# Patient Record
Sex: Female | Born: 1975 | Race: White | Hispanic: No | Marital: Married | Smoking: Never smoker
Health system: Southern US, Community
[De-identification: ages and names within clinical notes are randomized; demographics above are authoritative.]

## PROBLEM LIST (undated history)

## (undated) HISTORY — PX: ANAL FISSURECTOMY: SUR608

## (undated) HISTORY — PX: HERNIA REPAIR: SHX51

---

## 2020-05-30 ENCOUNTER — Other Ambulatory Visit: Payer: Self-pay | Admitting: Family Medicine

## 2020-05-30 DIAGNOSIS — R928 Other abnormal and inconclusive findings on diagnostic imaging of breast: Secondary | ICD-10-CM

## 2020-06-07 ENCOUNTER — Other Ambulatory Visit: Payer: Self-pay

## 2020-06-07 ENCOUNTER — Ambulatory Visit
Admission: RE | Admit: 2020-06-07 | Discharge: 2020-06-07 | Disposition: A | Discharge status: Home / Self Care | Payer: Commercial Managed Care - PPO | Source: Ambulatory Visit | Attending: Family Medicine | Admitting: Family Medicine

## 2020-06-07 DIAGNOSIS — R928 Other abnormal and inconclusive findings on diagnostic imaging of breast: Secondary | ICD-10-CM

## 2020-06-20 ENCOUNTER — Encounter: Payer: Self-pay | Admitting: Adult Health

## 2020-06-20 DIAGNOSIS — C50412 Malignant neoplasm of upper-outer quadrant of left female breast: Secondary | ICD-10-CM | POA: Insufficient documentation

## 2020-06-20 DIAGNOSIS — Z170 Estrogen receptor positive status [ER+]: Secondary | ICD-10-CM | POA: Insufficient documentation

## 2020-06-28 ENCOUNTER — Other Ambulatory Visit: Payer: Self-pay | Admitting: General Surgery

## 2020-06-28 ENCOUNTER — Other Ambulatory Visit (HOSPITAL_COMMUNITY): Payer: Self-pay | Admitting: General Surgery

## 2020-06-28 DIAGNOSIS — C50412 Malignant neoplasm of upper-outer quadrant of left female breast: Secondary | ICD-10-CM

## 2020-06-29 ENCOUNTER — Ambulatory Visit (HOSPITAL_COMMUNITY)
Admission: RE | Admit: 2020-06-29 | Discharge: 2020-06-29 | Disposition: A | Discharge status: Home / Self Care | Payer: Commercial Managed Care - PPO | Source: Ambulatory Visit | Attending: General Surgery | Admitting: General Surgery

## 2020-06-29 DIAGNOSIS — Z170 Estrogen receptor positive status [ER+]: Secondary | ICD-10-CM | POA: Insufficient documentation

## 2020-06-29 DIAGNOSIS — C50412 Malignant neoplasm of upper-outer quadrant of left female breast: Secondary | ICD-10-CM | POA: Insufficient documentation

## 2020-06-29 MED ORDER — GADOBUTROL 1 MMOL/ML IV SOLN
7.0000 mL | Freq: Once | INTRAVENOUS | Status: AC | PRN
  Administered 2020-06-29: 7 mL via INTRAVENOUS

## 2020-07-03 ENCOUNTER — Other Ambulatory Visit: Payer: Self-pay | Admitting: General Surgery

## 2020-07-03 DIAGNOSIS — C50412 Malignant neoplasm of upper-outer quadrant of left female breast: Secondary | ICD-10-CM

## 2020-07-06 ENCOUNTER — Other Ambulatory Visit: Payer: Self-pay | Admitting: General Surgery

## 2020-07-06 DIAGNOSIS — C50412 Malignant neoplasm of upper-outer quadrant of left female breast: Secondary | ICD-10-CM

## 2020-07-09 ENCOUNTER — Encounter (HOSPITAL_BASED_OUTPATIENT_CLINIC_OR_DEPARTMENT_OTHER): Payer: Self-pay | Admitting: General Surgery

## 2020-07-09 ENCOUNTER — Other Ambulatory Visit: Payer: Self-pay

## 2020-07-09 ENCOUNTER — Other Ambulatory Visit (HOSPITAL_COMMUNITY)
Admission: RE | Admit: 2020-07-09 | Discharge: 2020-07-09 | Disposition: A | Discharge status: Home / Self Care | Payer: Commercial Managed Care - PPO | Source: Ambulatory Visit | Attending: General Surgery | Admitting: General Surgery

## 2020-07-09 DIAGNOSIS — Z20822 Contact with and (suspected) exposure to covid-19: Secondary | ICD-10-CM | POA: Insufficient documentation

## 2020-07-09 DIAGNOSIS — Z01812 Encounter for preprocedural laboratory examination: Secondary | ICD-10-CM | POA: Diagnosis present

## 2020-07-09 LAB — SARS CORONAVIRUS 2 (TAT 6-24 HRS): SARS Coronavirus 2: NEGATIVE

## 2020-07-10 ENCOUNTER — Ambulatory Visit
Admission: RE | Admit: 2020-07-10 | Discharge: 2020-07-10 | Disposition: A | Discharge status: Home / Self Care | Payer: Commercial Managed Care - PPO | Source: Ambulatory Visit | Attending: General Surgery | Admitting: General Surgery

## 2020-07-10 DIAGNOSIS — C50412 Malignant neoplasm of upper-outer quadrant of left female breast: Secondary | ICD-10-CM

## 2020-07-10 NOTE — Progress Notes (Signed)

## 2020-07-10 NOTE — H&P (Signed)
Theresa Tyler Appointment: 07/03/2020 2:30 PM Location: [facility] Patient #: [id number] DOB: Oct 29, 1975 Married / Language: Theresa Tyler / Race: White Female   History of Present Illness Stark Klein MD; 07/03/2020 3:12 PM) The patient is a 45 year old female who presents for a follow-up for Breast cancer. Pt is a lovely 45 yo F referred by Dr. Melanee Spry for consultation regarding a new left breast cancer 06/2020. She was found to have an area of distortion in the upper outer quadrant on screening. This was confirmed on diagnostic imaging and there was no ultrasound correlate and nodes were negative by ultrasound. 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 This was +/+/- with Ki 67 20%. 5 mm of cancer were seen on the biopsy. the clip migrated 1 cm from the lesion. 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 She has family history of pancreatic cancer in her paternal grandfather, colon polyps in her father, and a maternal aunt had breast cancer. She is a G2P2 who had menarche at age 34. First child was in the late 20s.   She is here to follow up. She had MRI showing only a 3 mm mass on the left. 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 (multicancer panel - 84 gene test). She is here to discuss.   MRI 06/29/20 FINDINGS: Breast composition: c. Heterogeneous fibroglandular tissue.  Background parenchymal enhancement: Moderate  Right breast: No mass or abnormal enhancement.  Left breast: There is a 3 x 3 mm focal enhancement with associated post biopsy artifact in the posterior upper-outer quadrant left breast consistent with recently diagnosed left breast cancer.  Lymph nodes: No abnormal appearing lymph nodes.  Ancillary findings: None.  IMPRESSION: Known recently biopsied left breast cancer in the posterior upper-outer quadrant left  breast.  RECOMMENDATION: Treatment plan  BI-RADS CATEGORY 6: Known biopsy-proven malignancy. dx mammo/us on left 05/30/20 COMPARISON: Previous exam(s). ACR Breast Density Category c: The breast tissue is heterogeneously dense, which may obscure small masses. FINDINGS: Spot compression tomosynthesis images in the CC and MLO projection as well as full paddle rolled medial and lateral CC images and a full paddle true lateral image were performed. These images demonstrate a possible persistent subtle area of distortion in the superior to upper-outer quadrant of the left breast. No suspicious palpable masses are identified in the superior to upper-outer quadrant of the left breast. Ultrasound of the upper-outer quadrant of the left breast demonstrates normal fibroglandular tissue. No suspicious masses or areas of shadowing are identified. Ultrasound of the left axilla demonstrates normal-appearing lymph nodes.   pathology 06/07/20 Breast, left, needle core biopsy, left breast upper outer - INVASIVE DUCTAL CARCINOMA, SEE COMMENT. - DUCTAL CARCINOMA IN SITU WITH CALCIFICATIONS AND NECROSIS The carcinoma appears grade 2 and measures 5 mm in greatest linear extent. E-cadherin is positive. The tumor cells are NEGATIVE for Her2 (1+). Estrogen Receptor: 100%, POSITIVE, STRONG STAINING INTENSITY Progesterone Receptor: 90%, POSITIVE, STRONG STAINING INTENSITY Proliferation Marker Ki67: 20%   Allergies [name], CNA; 07/03/2020 2:41 PM) Macrobid *ANTI-INFECTIVE AGENTS - MISC.*  Ceftin *CEPHALOSPORINS*  Allergies Reconciled   Medication History [name], CNA; 07/03/2020 2:41 PM) No Current Medications Medications Reconciled    Review of Systems Stark Klein MD; 07/03/2020 3:12 PM) All other systems negative   Physical Exam Stark Klein MD; 07/03/2020 3:13 PM) General Mental Status-Alert. General  Appearance-Consistent with stated age. Hydration-Well  hydrated. Voice-Normal.  Head and Neck Head-normocephalic, atraumatic with no lesions or palpable masses.  Breast Note: no bruising UOQ left breast.   Neurologic Neurologic evaluation reveals -alert and oriented x 3 with no impairment of recent or remote memory. Mental Status-Normal.  Musculoskeletal Global Assessment -Note: no gross deformities.  Normal Exam - Left-Upper Extremity Strength Normal and Lower Extremity Strength Normal. Normal Exam - Right-Upper Extremity Strength Normal and Lower Extremity Strength Normal.    Assessment & Plan Stark Klein MD; 07/03/2020 3:14 PM) MALIGNANT NEOPLASM OF UPPER-OUTER QUADRANT OF LEFT BREAST IN FEMALE, ESTROGEN RECEPTOR POSITIVE (C50.412) Impression: MR negative for any large mass or other concerning lesions.  Genetics negative. Will plan left seed localized lumpectomy and SLN bx.  The surgical procedure was described to the patient. I discussed the incision type and location and that we would need radiology involved on with a wire or seed marker and/or sentinel node.  The risks and benefits of the procedure were described to the patient and she wishes to proceed.  We discussed the risks bleeding, infection, damage to other structures, need for further procedures/surgeries. We discussed the risk of seroma. The patient was advised if the area in the breast in cancer, we may need to go back to surgery for additional tissue to obtain negative margins or for a lymph node biopsy. The patient was advised that these are the most common complications, but that others can occur as well. They were advised against taking aspirin or other anti-inflammatory agents/blood thinners the week before surgery. Current Plans You are being scheduled for surgery- Our schedulers will call you.  You should hear from our office's scheduling department within 5 working days about the location, date, and time of surgery. We try to make  accommodations for patient's preferences in scheduling surgery, but sometimes the OR schedule or the surgeon's schedule prevents [place] from making those accommodations.  If you have not heard from our office [phone]) in 5 working days, call the office and ask for your surgeon's nurse.  If you have other questions about your diagnosis, plan, or surgery, call the office and ask for your surgeon's nurse.  Pt Education - flb breast cancer surgery: discussed with patient and provided information.   Signed electronically by Stark Klein, MD (07/03/2020 3:14 PM)

## 2020-07-12 ENCOUNTER — Ambulatory Visit (HOSPITAL_BASED_OUTPATIENT_CLINIC_OR_DEPARTMENT_OTHER)
Admission: RE | Admit: 2020-07-12 | Discharge: 2020-07-12 | Disposition: A | Discharge status: Home / Self Care | Payer: Commercial Managed Care - PPO | Attending: General Surgery | Admitting: General Surgery

## 2020-07-12 ENCOUNTER — Ambulatory Visit (HOSPITAL_BASED_OUTPATIENT_CLINIC_OR_DEPARTMENT_OTHER): Payer: Commercial Managed Care - PPO | Admitting: Anesthesiology

## 2020-07-12 ENCOUNTER — Ambulatory Visit (HOSPITAL_COMMUNITY)
Admission: RE | Admit: 2020-07-12 | Discharge: 2020-07-12 | Disposition: A | Discharge status: Home / Self Care | Payer: Commercial Managed Care - PPO | Source: Ambulatory Visit | Attending: General Surgery | Admitting: General Surgery

## 2020-07-12 ENCOUNTER — Ambulatory Visit
Admission: RE | Admit: 2020-07-12 | Discharge: 2020-07-12 | Disposition: A | Discharge status: Home / Self Care | Payer: Commercial Managed Care - PPO | Source: Ambulatory Visit | Attending: General Surgery | Admitting: General Surgery

## 2020-07-12 ENCOUNTER — Other Ambulatory Visit: Payer: Self-pay

## 2020-07-12 ENCOUNTER — Encounter (HOSPITAL_BASED_OUTPATIENT_CLINIC_OR_DEPARTMENT_OTHER): Payer: Self-pay | Admitting: General Surgery

## 2020-07-12 ENCOUNTER — Encounter (HOSPITAL_BASED_OUTPATIENT_CLINIC_OR_DEPARTMENT_OTHER)
Admission: RE | Disposition: A | Discharge status: Home / Self Care | Payer: Self-pay | Source: Home / Self Care | Attending: General Surgery

## 2020-07-12 DIAGNOSIS — Z803 Family history of malignant neoplasm of breast: Secondary | ICD-10-CM | POA: Insufficient documentation

## 2020-07-12 DIAGNOSIS — Z170 Estrogen receptor positive status [ER+]: Secondary | ICD-10-CM

## 2020-07-12 DIAGNOSIS — C50412 Malignant neoplasm of upper-outer quadrant of left female breast: Secondary | ICD-10-CM | POA: Insufficient documentation

## 2020-07-12 HISTORY — PX: BREAST LUMPECTOMY WITH RADIOACTIVE SEED LOCALIZATION: SHX6424

## 2020-07-12 HISTORY — PX: BREAST LUMPECTOMY WITH RADIOACTIVE SEED AND SENTINEL LYMPH NODE BIOPSY: SHX6550

## 2020-07-12 LAB — POCT PREGNANCY, URINE: Preg Test, Ur: NEGATIVE

## 2020-07-12 SURGERY — BREAST LUMPECTOMY WITH RADIOACTIVE SEED AND SENTINEL LYMPH NODE BIOPSY
Anesthesia: General | Site: Breast

## 2020-07-12 MED ORDER — PROMETHAZINE HCL 25 MG/ML IJ SOLN: 6.2500 mg | INTRAMUSCULAR | Status: DC | PRN

## 2020-07-12 MED ORDER — SODIUM CHLORIDE (PF) 0.9 % IJ SOLN
INTRAMUSCULAR | Status: AC
  Filled 2020-07-12: qty 20

## 2020-07-12 MED ORDER — FENTANYL CITRATE (PF) 100 MCG/2ML IJ SOLN
100.0000 ug | Freq: Once | INTRAMUSCULAR | Status: AC
  Administered 2020-07-12: 100 ug via INTRAVENOUS

## 2020-07-12 MED ORDER — MIDAZOLAM HCL 2 MG/2ML IJ SOLN
2.0000 mg | Freq: Once | INTRAMUSCULAR | Status: AC
  Administered 2020-07-12: 2 mg via INTRAVENOUS

## 2020-07-12 MED ORDER — PROPOFOL 10 MG/ML IV BOLUS
INTRAVENOUS | Status: DC | PRN
  Administered 2020-07-12: 200 mg via INTRAVENOUS

## 2020-07-12 MED ORDER — CHLORHEXIDINE GLUCONATE CLOTH 2 % EX PADS: 6.0000 | MEDICATED_PAD | Freq: Once | CUTANEOUS | Status: DC

## 2020-07-12 MED ORDER — CEFAZOLIN SODIUM-DEXTROSE 2-4 GM/100ML-% IV SOLN
2.0000 g | INTRAVENOUS | Status: AC
  Administered 2020-07-12: 2 g via INTRAVENOUS

## 2020-07-12 MED ORDER — METHYLENE BLUE 0.5 % INJ SOLN
INTRAVENOUS | Status: AC
  Filled 2020-07-12: qty 20

## 2020-07-12 MED ORDER — HYDROMORPHONE HCL 1 MG/ML IJ SOLN: 0.2500 mg | INTRAMUSCULAR | Status: DC | PRN

## 2020-07-12 MED ORDER — OXYCODONE HCL 5 MG/5ML PO SOLN: 5.0000 mg | Freq: Once | ORAL | Status: AC | PRN

## 2020-07-12 MED ORDER — FENTANYL CITRATE (PF) 100 MCG/2ML IJ SOLN
INTRAMUSCULAR | Status: AC
  Filled 2020-07-12: qty 2

## 2020-07-12 MED ORDER — AMISULPRIDE (ANTIEMETIC) 5 MG/2ML IV SOLN: 10.0000 mg | Freq: Once | INTRAVENOUS | Status: DC | PRN

## 2020-07-12 MED ORDER — ROPIVACAINE HCL 5 MG/ML IJ SOLN
INTRAMUSCULAR | Status: DC | PRN
  Administered 2020-07-12: 30 mL via PERINEURAL

## 2020-07-12 MED ORDER — MIDAZOLAM HCL 2 MG/2ML IJ SOLN
INTRAMUSCULAR | Status: AC
  Filled 2020-07-12: qty 2

## 2020-07-12 MED ORDER — LIDOCAINE-EPINEPHRINE (PF) 1 %-1:200000 IJ SOLN
INTRAMUSCULAR | Status: DC | PRN
  Administered 2020-07-12: 13 mL via INTRAMUSCULAR

## 2020-07-12 MED ORDER — PROPOFOL 10 MG/ML IV BOLUS
INTRAVENOUS | Status: AC
  Filled 2020-07-12: qty 20

## 2020-07-12 MED ORDER — FENTANYL CITRATE (PF) 100 MCG/2ML IJ SOLN
INTRAMUSCULAR | Status: DC | PRN
  Administered 2020-07-12: 25 ug via INTRAVENOUS
  Administered 2020-07-12: 50 ug via INTRAVENOUS

## 2020-07-12 MED ORDER — DEXAMETHASONE SODIUM PHOSPHATE 4 MG/ML IJ SOLN
INTRAMUSCULAR | Status: DC | PRN
  Administered 2020-07-12: 10 mg via INTRAVENOUS

## 2020-07-12 MED ORDER — OXYCODONE HCL 5 MG PO TABS
5.0000 mg | ORAL_TABLET | Freq: Once | ORAL | Status: AC | PRN
  Administered 2020-07-12: 5 mg via ORAL

## 2020-07-12 MED ORDER — LIDOCAINE HCL (CARDIAC) PF 100 MG/5ML IV SOSY
PREFILLED_SYRINGE | INTRAVENOUS | Status: DC | PRN
  Administered 2020-07-12: 60 mg via INTRAVENOUS

## 2020-07-12 MED ORDER — LACTATED RINGERS IV SOLN: INTRAVENOUS | Status: DC

## 2020-07-12 MED ORDER — OXYCODONE HCL 5 MG PO TABS
ORAL_TABLET | ORAL | Status: AC
  Filled 2020-07-12: qty 1

## 2020-07-12 MED ORDER — TECHNETIUM TC 99M TILMANOCEPT KIT
1.0000 | PACK | Freq: Once | INTRAVENOUS | Status: AC | PRN
  Administered 2020-07-12: 1 via INTRADERMAL

## 2020-07-12 MED ORDER — MEPERIDINE HCL 25 MG/ML IJ SOLN: 6.2500 mg | INTRAMUSCULAR | Status: DC | PRN

## 2020-07-12 MED ORDER — ONDANSETRON HCL 4 MG/2ML IJ SOLN
INTRAMUSCULAR | Status: DC | PRN
  Administered 2020-07-12: 4 mg via INTRAVENOUS

## 2020-07-12 MED ORDER — ACETAMINOPHEN 500 MG PO TABS
1000.0000 mg | ORAL_TABLET | ORAL | Status: AC
  Administered 2020-07-12: 1000 mg via ORAL

## 2020-07-12 MED ORDER — DROPERIDOL 2.5 MG/ML IJ SOLN
INTRAMUSCULAR | Status: DC | PRN
  Administered 2020-07-12: .625 mg via INTRAVENOUS

## 2020-07-12 MED ORDER — ACETAMINOPHEN 500 MG PO TABS
ORAL_TABLET | ORAL | Status: AC
  Filled 2020-07-12: qty 2

## 2020-07-12 MED ORDER — OXYCODONE HCL 5 MG PO TABS: 5.0000 mg | ORAL_TABLET | Freq: Four times a day (QID) | ORAL | 0 refills | Status: AC | PRN

## 2020-07-12 MED ORDER — MIDAZOLAM HCL 5 MG/5ML IJ SOLN
INTRAMUSCULAR | Status: DC | PRN
  Administered 2020-07-12: 1 mg via INTRAVENOUS

## 2020-07-12 MED ORDER — CEFAZOLIN SODIUM-DEXTROSE 2-4 GM/100ML-% IV SOLN
INTRAVENOUS | Status: AC
  Filled 2020-07-12: qty 100

## 2020-07-12 SURGICAL SUPPLY — 52 items
ADH SKN CLS APL DERMABOND .7 (GAUZE/BANDAGES/DRESSINGS) ×2
APL PRP STRL LF DISP 70% ISPRP (MISCELLANEOUS) ×2
BINDER BREAST LRG (GAUZE/BANDAGES/DRESSINGS) IMPLANT
BINDER BREAST MEDIUM (GAUZE/BANDAGES/DRESSINGS) IMPLANT
BINDER BREAST XLRG (GAUZE/BANDAGES/DRESSINGS) IMPLANT
BINDER BREAST XXLRG (GAUZE/BANDAGES/DRESSINGS) IMPLANT
BLADE SURG 10 STRL SS (BLADE) ×3 IMPLANT
BLADE SURG 15 STRL LF DISP TIS (BLADE) ×2 IMPLANT
BLADE SURG 15 STRL SS (BLADE) ×3
BNDG COHESIVE 4X5 TAN STRL (GAUZE/BANDAGES/DRESSINGS) ×3 IMPLANT
CANISTER SUC SOCK COL 7IN (MISCELLANEOUS) IMPLANT
CANISTER SUCT 1200ML W/VALVE (MISCELLANEOUS) IMPLANT
CHLORAPREP W/TINT 26 (MISCELLANEOUS) ×3 IMPLANT
CLIP VESOCCLUDE LG 6/CT (CLIP) ×3 IMPLANT
CLIP VESOCCLUDE MED 6/CT (CLIP) ×12 IMPLANT
COVER MAYO STAND STRL (DRAPES) ×3 IMPLANT
COVER PROBE W GEL 5X96 (DRAPES) ×3 IMPLANT
DECANTER SPIKE VIAL GLASS SM (MISCELLANEOUS) IMPLANT
DERMABOND ADVANCED (GAUZE/BANDAGES/DRESSINGS) ×1
DERMABOND ADVANCED .7 DNX12 (GAUZE/BANDAGES/DRESSINGS) ×2 IMPLANT
DRAPE UTILITY XL STRL (DRAPES) ×3 IMPLANT
ELECT COATED BLADE 2.86 ST (ELECTRODE) ×3 IMPLANT
ELECT REM PT RETURN 9FT ADLT (ELECTROSURGICAL) ×3
ELECTRODE REM PT RTRN 9FT ADLT (ELECTROSURGICAL) ×2 IMPLANT
GAUZE SPONGE 4X4 12PLY STRL LF (GAUZE/BANDAGES/DRESSINGS) ×3 IMPLANT
GLOVE SURG ENC MOIS LTX SZ6 (GLOVE) ×3 IMPLANT
GLOVE SURG UNDER POLY LF SZ6.5 (GLOVE) ×3 IMPLANT
GOWN STRL REUS W/ TWL LRG LVL3 (GOWN DISPOSABLE) ×2 IMPLANT
GOWN STRL REUS W/TWL 2XL LVL3 (GOWN DISPOSABLE) ×3 IMPLANT
GOWN STRL REUS W/TWL LRG LVL3 (GOWN DISPOSABLE) ×3
KIT MARKER MARGIN INK (KITS) ×3 IMPLANT
LIGHT WAVEGUIDE WIDE FLAT (MISCELLANEOUS) ×3 IMPLANT
NEEDLE HYPO 25X1 1.5 SAFETY (NEEDLE) ×3 IMPLANT
NS IRRIG 1000ML POUR BTL (IV SOLUTION) ×3 IMPLANT
PACK BASIN DAY SURGERY FS (CUSTOM PROCEDURE TRAY) ×3 IMPLANT
PACK UNIVERSAL I (CUSTOM PROCEDURE TRAY) ×3 IMPLANT
PENCIL SMOKE EVACUATOR (MISCELLANEOUS) ×3 IMPLANT
SLEEVE SCD COMPRESS KNEE MED (STOCKING) ×3 IMPLANT
SPONGE LAP 18X18 RF (DISPOSABLE) ×3 IMPLANT
STOCKINETTE IMPERVIOUS LG (DRAPES) ×3 IMPLANT
STRIP CLOSURE SKIN 1/2X4 (GAUZE/BANDAGES/DRESSINGS) ×3 IMPLANT
SUT MNCRL AB 4-0 PS2 18 (SUTURE) ×3 IMPLANT
SUT SILK 2 0 SH (SUTURE) IMPLANT
SUT VIC AB 2-0 SH 27 (SUTURE) ×3
SUT VIC AB 2-0 SH 27XBRD (SUTURE) ×2 IMPLANT
SUT VIC AB 3-0 SH 27 (SUTURE) ×3
SUT VIC AB 3-0 SH 27X BRD (SUTURE) ×2 IMPLANT
SYR CONTROL 10ML LL (SYRINGE) ×3 IMPLANT
TOWEL GREEN STERILE FF (TOWEL DISPOSABLE) ×3 IMPLANT
TRAY FAXITRON CT DISP (TRAY / TRAY PROCEDURE) ×3 IMPLANT
TUBE CONNECTING 20X1/4 (TUBING) IMPLANT
YANKAUER SUCT BULB TIP NO VENT (SUCTIONS) ×3 IMPLANT

## 2020-07-12 NOTE — Progress Notes (Signed)
Nuclear medicine at bedside. RN remained with patient and provided emotional support. Patient's vital signs remained stable and tolerated the procedure well.

## 2020-07-12 NOTE — Transfer of Care (Signed)
Immediate Anesthesia Transfer of Care Note  Patient: Theresa Tyler  Procedure(s) Performed: LEFT SEED LOCALIZED LUMPECTOMY WITH SENTINEL LYMPH NODE BIOPSY (Left Breast)  Patient Location: PACU  Anesthesia Type:GA combined with regional for post-op pain  Level of Consciousness: awake, alert , oriented, drowsy and patient cooperative  Airway & Oxygen Therapy: Patient Spontanous Breathing and Patient connected to nasal cannula oxygen  Post-op Assessment: Report given to RN and Post -op Vital signs reviewed and stable  Post vital signs: Reviewed and stable  Last Vitals:  Vitals Value Taken Time  BP    Temp    Pulse    Resp    SpO2      Last Pain:  Vitals:   07/12/20 0729  TempSrc: Oral  PainSc: 0-No pain         Complications: No complications documented.

## 2020-07-12 NOTE — Anesthesia Procedure Notes (Signed)
Anesthesia Regional Block: Pectoralis block   Pre-Anesthetic Checklist: ,, timeout performed, Correct Patient, Correct Site, Correct Laterality, Correct Procedure, Correct Position, site marked, Risks and benefits discussed,  Surgical consent,  Pre-op evaluation,  At surgeon's request and post-op pain management  Laterality: Left  Prep: chloraprep       Needles:  Injection technique: Single-shot  Needle Type: Stimiplex     Needle Length: 9cm  Needle Gauge: 21     Additional Needles:   Procedures:,,,, ultrasound used (permanent image in chart),,,,  Narrative:  Start time: 07/12/2020 8:01 AM End time: 07/12/2020 8:06 AM Injection made incrementally with aspirations every 5 mL.  Performed by: Personally  Anesthesiologist: Lynda Rainwater, MD

## 2020-07-12 NOTE — Anesthesia Postprocedure Evaluation (Signed)
Anesthesia Post Note  Patient: Theresa Tyler  Procedure(s) Performed: LEFT SEED LOCALIZED LUMPECTOMY WITH SENTINEL LYMPH NODE BIOPSY (Left Breast)     Patient location during evaluation: PACU Anesthesia Type: General Level of consciousness: awake and alert Pain management: pain level controlled Vital Signs Assessment: post-procedure vital signs reviewed and stable Respiratory status: spontaneous breathing, nonlabored ventilation and respiratory function stable Cardiovascular status: blood pressure returned to baseline and stable Postop Assessment: no apparent nausea or vomiting Anesthetic complications: no   No complications documented.  Last Vitals:  Vitals:   07/12/20 1130 07/12/20 1143  BP: 122/74 125/72  Pulse: 61 72  Resp: 11 15  Temp:  36.8 C  SpO2: 97% 99%    Last Pain:  Vitals:   07/12/20 1151  TempSrc:   PainSc: [place]

## 2020-07-12 NOTE — Anesthesia Procedure Notes (Signed)
Procedure Name: LMA Insertion Date/Time: 07/12/2020 9:14 AM Performed by: Willa Frater, CRNA Pre-anesthesia Checklist: Patient identified, Emergency Drugs available, Suction available and Patient being monitored Patient Re-evaluated:Patient Re-evaluated prior to induction Oxygen Delivery Method: Circle system utilized Preoxygenation: Pre-oxygenation with 100% oxygen Induction Type: IV induction Ventilation: Mask ventilation without difficulty LMA: LMA inserted LMA Size: 4.0 Number of attempts: 1 Airway Equipment and Method: Bite block Placement Confirmation: positive ETCO2 Tube secured with: Tape Dental Injury: Teeth and Oropharynx as per pre-operative assessment

## 2020-07-12 NOTE — Op Note (Signed)
Left Breast Radioactive seed localized lumpectomy and sentinel lymph node biopsy  Indications: This patient presents with history of left breast cancer, upper outer quadrant, grade 2 invasive ductal carcinoma, cT1aN0, +/+/-  Pre-operative Diagnosis: left breast cancer  Post-operative Diagnosis: Same  Surgeon: Stark Klein   Anesthesia: General endotracheal anesthesia  ASA Class: 3  Procedure Details  The patient was seen in the Holding Room. The risks, benefits, complications, treatment options, and expected outcomes were discussed with the patient. The possibilities of bleeding, infection, the need for additional procedures, failure to diagnose a condition, and creating a complication requiring transfusion or operation were discussed with the patient. The patient concurred with the proposed plan, giving informed consent.  The site of surgery properly noted/marked. The patient was taken to Operating Room # 1, identified, and the procedure verified as left Breast Seed localized Lumpectomy with sentinel lymph node biopsy. A Time Out was held and the above information confirmed.  The left arm, breast, and chest were prepped and draped in standard fashion. The lumpectomy was performed by creating a superolateral circumareolar incision near the previously placed radioactive seed.  Dissection was carried down to around the point of maximum signal intensity. The cautery was used to perform the dissection.  Hemostasis was achieved with cautery. The edges of the cavity were marked with large clips, with one each medial, lateral, inferior and superior, and two clips posteriorly.   The specimen was inked with the margin marker paint kit.    Specimen radiography confirmed inclusion of the mammographic lesion, the clip, and the seed.  Additional margins were taken superiorly, medially, laterally, and inferiorly. The background signal in the breast was zero.  The wound was irrigated and closed with 3-0 vicryl in  layers and 4-0 monocryl subcuticular suture.    Using a hand-held gamma probe, Left axillary sentinel nodes were identified transcutaneously.  An oblique incision was created below the axillary hairline.  Dissection was carried through the clavipectoral fascia.  Three deep level 2 axillary sentinel nodes were removed.  Counts per second were 680, 197, and 80.    The background count was 20 cps.  The wound was irrigated.  Hemostasis was achieved with cautery.  The axillary incision was closed with a 3-0 vicryl deep dermal interrupted sutures and a 4-0 monocryl subcuticular closure.    Sterile dressings were applied. At the end of the operation, all sponge, instrument, and needle counts were correct.  Findings: grossly clear surgical margins and no adenopathy, posterior margin is pectoralis  Estimated Blood Loss:  min         Specimens: Left breast tissue with seed, additional inferior, lateral, medial, and superior margins and three left axillary sentinel lymph nodes.             Complications:  None; patient tolerated the procedure well.         Disposition: PACU - hemodynamically stable.         Condition: stable

## 2020-07-12 NOTE — Progress Notes (Signed)
Assisted Dr. Miller with left, ultrasound guided, pectoralis block. Side rails up, monitors on throughout procedure. See vital signs in flow sheet. Tolerated Procedure well. 

## 2020-07-12 NOTE — Discharge Instructions (Addendum)
[place] Office Phone Number [phone]  BREAST BIOPSY/ PARTIAL MASTECTOMY: POST OP INSTRUCTIONS  Always review your discharge instruction sheet given to you by the facility where your surgery was performed.  IF YOU HAVE DISABILITY OR FAMILY LEAVE FORMS, YOU MUST BRING THEM TO THE OFFICE FOR PROCESSING.  DO NOT GIVE THEM TO YOUR DOCTOR.  1. A prescription for pain medication may be given to you upon discharge.  Take your pain medication as prescribed, if needed.  If narcotic pain medicine is not needed, then you may take acetaminophen (Tylenol) or ibuprofen (Advil) as needed. 2. Take your usually prescribed medications unless otherwise directed 3. If you need a refill on your pain medication, please contact your pharmacy.  They will contact our office to request authorization.  Prescriptions will not be filled after 5pm or on week-ends. 4. You should eat very light the first 24 hours after surgery, such as soup, crackers, pudding, etc.  Resume your normal diet the day after surgery. 5. Most patients will experience some swelling and bruising in the breast.  Ice packs and a good support bra will help.  Swelling and bruising can take several days to resolve.  6. It is common to experience some constipation if taking pain medication after surgery.  Increasing fluid intake and taking a stool softener will usually help or prevent this problem from occurring.  A mild laxative (Milk of Magnesia or Miralax) should be taken according to package directions if there are no bowel movements after 48 hours. 7. Unless discharge instructions indicate otherwise, you may remove your bandages 48 hours after surgery, and you may shower at that time.  You may have steri-strips (small skin tapes) in place directly over the incision.  These strips should be left on the skin for 7-10 days.   Any sutures or staples will be removed at the office during your follow-up visit. 8. ACTIVITIES:  You may resume  regular daily activities (gradually increasing) beginning the next day.  Wearing a good support bra or sports bra (or the breast binder) minimizes pain and swelling.  You may have sexual intercourse when it is comfortable. a. You may drive when you no longer are taking prescription pain medication, you can comfortably wear a seatbelt, and you can safely maneuver your car and apply brakes. b. RETURN TO WORK:  __________1 week_______________ 9. You should see your doctor in the office for a follow-up appointment approximately two weeks after your surgery.  Your doctor's nurse will typically make your follow-up appointment when she calls you with your pathology report.  Expect your pathology report 2-3 business days after your surgery.  You may call to check if you do not hear from [place] after three days.   WHEN TO CALL YOUR DOCTOR: 1. Fever over 101.0 2. Nausea and/or vomiting. 3. Extreme swelling or bruising. 4. Continued bleeding from incision. 5. Increased pain, redness, or drainage from the incision.  The clinic staff is available to answer your questions during regular business hours.  Please don't hesitate to call and ask to speak to one of the nurses for clinical concerns.  If you have a medical emergency, go to the nearest emergency room or call 911.  A surgeon from [facility] is always on call at the hospital.  For further questions, please visit [email or web address]    Post Anesthesia Home Care Instructions  Activity: Get plenty of rest for the remainder of the day. A responsible individual must stay with you for 24  hours following the procedure.  For the next 24 hours, DO NOT: -Drive a car -Paediatric nurse -Drink alcoholic beverages -Take any medication unless instructed by your physician -Make any legal decisions or sign important papers.  Meals: Start with liquid foods such as gelatin or soup. Progress to regular foods as tolerated. Avoid greasy, spicy,  heavy foods. If nausea and/or vomiting occur, drink only clear liquids until the nausea and/or vomiting subsides. Call your physician if vomiting continues.  Special Instructions/Symptoms: Your throat may feel dry or sore from the anesthesia or the breathing tube placed in your throat during surgery. If this causes discomfort, gargle with warm salt water. The discomfort should disappear within 24 hours.  If you had a scopolamine patch placed behind your ear for the management of post- operative nausea and/or vomiting:  1. The medication in the patch is effective for 72 hours, after which it should be removed.  Wrap patch in a tissue and discard in the trash. Wash hands thoroughly with soap and water. 2. You may remove the patch earlier than 72 hours if you experience unpleasant side effects which may include dry mouth, dizziness or visual disturbances. 3. Avoid touching the patch. Wash your hands with soap and water after contact with the patch.    No Tylenol until 1:35PM.

## 2020-07-12 NOTE — Interval H&P Note (Signed)
History and Physical Interval Note:  07/12/2020 8:41 AM  Theresa Tyler  has presented today for surgery, with the diagnosis of LEFT BREAST CANCER.  The various methods of treatment have been discussed with the patient and family. After consideration of risks, benefits and other options for treatment, the patient has consented to  Procedure(s) with comments: LEFT SEED LOCALIZED LUMPECTOMY WITH SENTINEL LYMPH NODE BIOPSY (N/A) - GEN AND PEC BLOCK 60 as a surgical intervention.  The patient's history has been reviewed, patient examined, no change in status, stable for surgery.  I have reviewed the patient's chart and labs.  Questions were answered to the patient's satisfaction.     Stark Klein

## 2020-07-12 NOTE — Anesthesia Preprocedure Evaluation (Signed)
Anesthesia Evaluation  Patient identified by MRN, date of birth, ID band Patient awake    Reviewed: Allergy & Precautions, NPO status , Patient's Chart, lab work & pertinent test results  Airway Mallampati: II  TM Distance: >3 FB Neck ROM: Full    Dental no notable dental hx.    Pulmonary neg pulmonary ROS,    Pulmonary exam normal breath sounds clear to auscultation       Cardiovascular negative cardio ROS Normal cardiovascular exam Rhythm:Regular Rate:Normal     Neuro/Psych negative neurological ROS  negative psych ROS   GI/Hepatic negative GI ROS, Neg liver ROS,   Endo/Other  negative endocrine ROS  Renal/GU negative Renal ROS  negative genitourinary   Musculoskeletal negative musculoskeletal ROS (+)   Abdominal   Peds negative pediatric ROS (+)  Hematology negative hematology ROS (+)   Anesthesia Other Findings Breast Cancer  Reproductive/Obstetrics negative OB ROS                             Anesthesia Physical Anesthesia Plan  ASA: III  Anesthesia Plan: General   Post-op Pain Management:  Regional for Post-op pain   Induction: Intravenous  PONV Risk Score and Plan: 3 and Ondansetron, Dexamethasone, Midazolam and Treatment may vary due to age or medical condition  Airway Management Planned: LMA  Additional Equipment:   Intra-op Plan:   Post-operative Plan: Extubation in OR  Informed Consent: I have reviewed the patients History and Physical, chart, labs and discussed the procedure including the risks, benefits and alternatives for the proposed anesthesia with the patient or authorized representative who has indicated his/her understanding and acceptance.     Dental advisory given  Plan Discussed with: CRNA  Anesthesia Plan Comments:         Anesthesia Quick Evaluation

## 2020-07-13 ENCOUNTER — Encounter (HOSPITAL_BASED_OUTPATIENT_CLINIC_OR_DEPARTMENT_OTHER): Payer: Self-pay | Admitting: General Surgery

## 2020-07-16 ENCOUNTER — Encounter (HOSPITAL_BASED_OUTPATIENT_CLINIC_OR_DEPARTMENT_OTHER): Payer: Self-pay | Admitting: General Surgery

## 2020-07-16 LAB — SURGICAL PATHOLOGY

## 2020-08-17 ENCOUNTER — Encounter (HOSPITAL_COMMUNITY): Payer: Self-pay

## 2021-10-15 ENCOUNTER — Encounter (HOSPITAL_COMMUNITY): Payer: Self-pay

## 2022-02-25 IMAGING — MG MM PLC BREAST LOC DEV 1ST LESION INC MAMMO GUIDE*L*
7 series · 7 of 7 positions shown · non-contrast
Comparison: Previous exam(s).

CLINICAL DATA: Patient with LEFT breast invasive carcinoma and DCIS
scheduled for lumpectomy requiring preoperative radioactive seed
localization.

EXAM:
MAMMOGRAPHIC GUIDED RADIOACTIVE SEED LOCALIZATION OF THE LEFT BREAST

[L CC (1 of 5)]
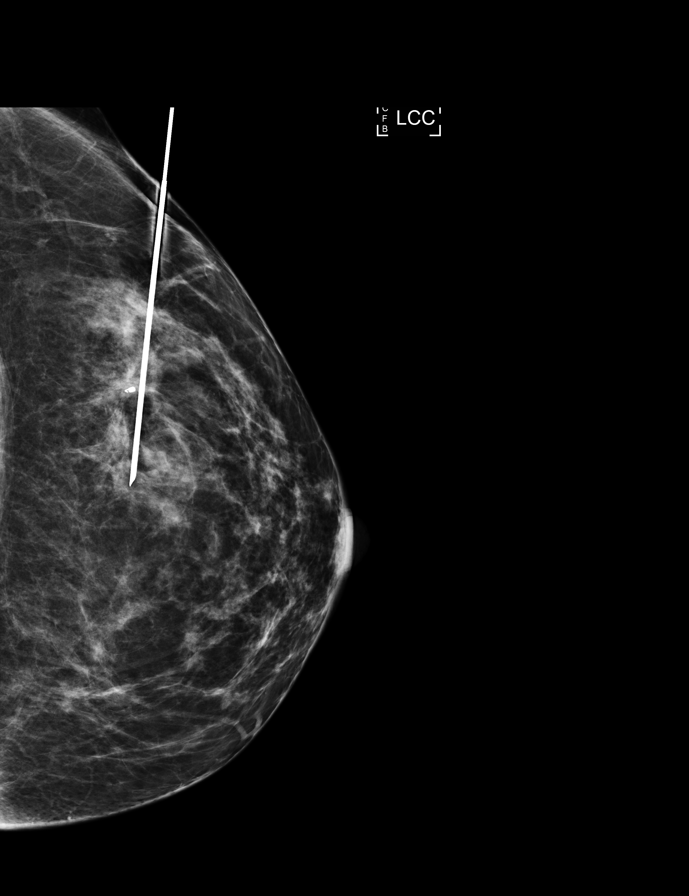

[L CC (2 of 5)]
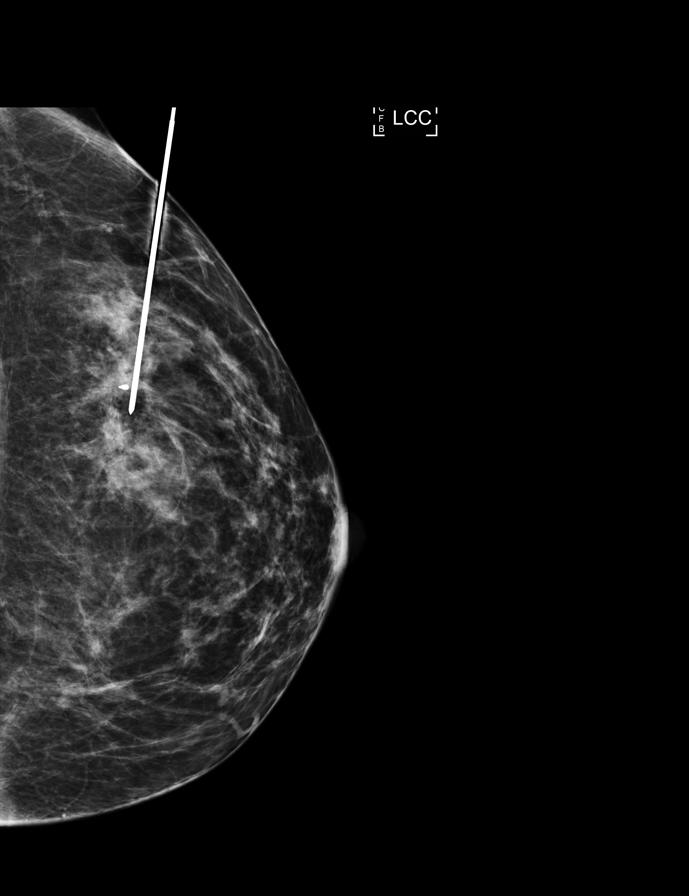

[L CC (3 of 5)]
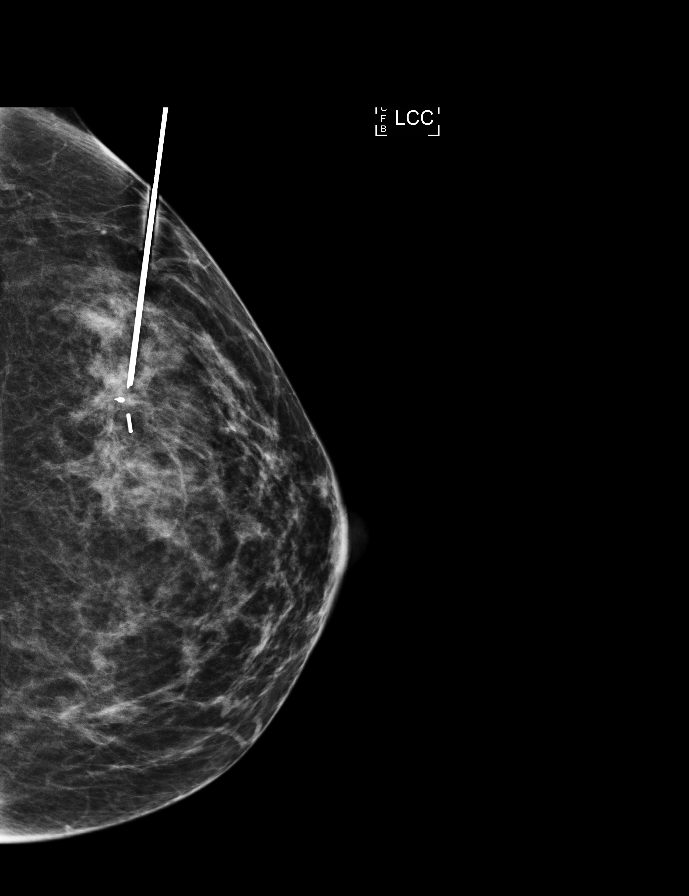

[L CC (4 of 5)]
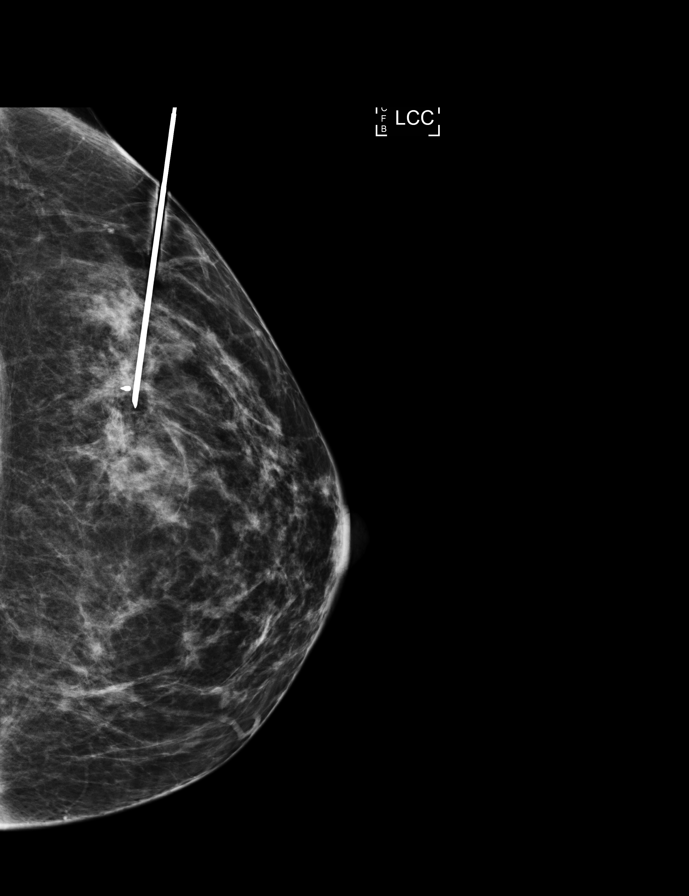

[L LM (1 of 2)]
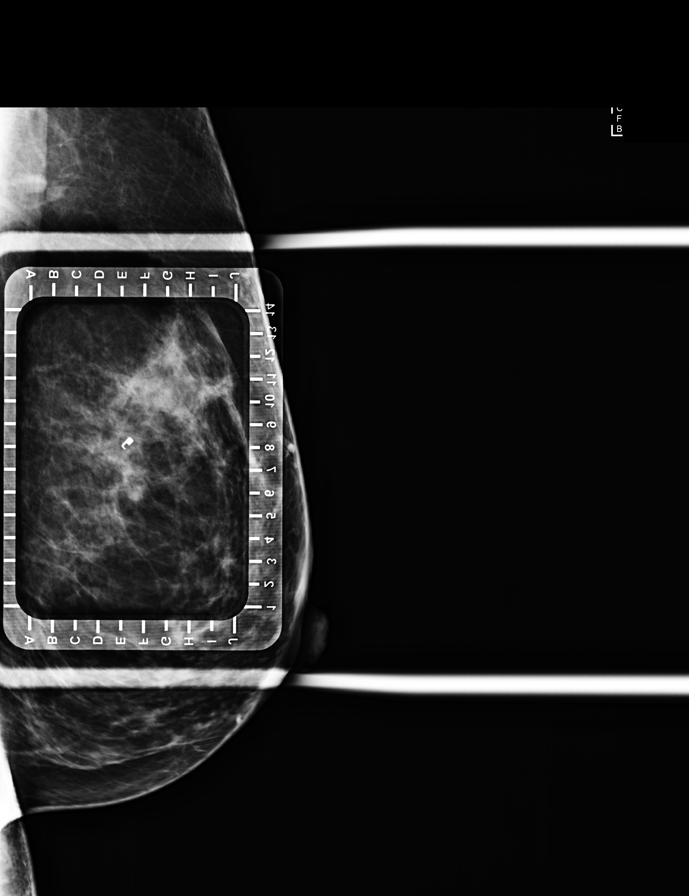

[L CC (5 of 5)]
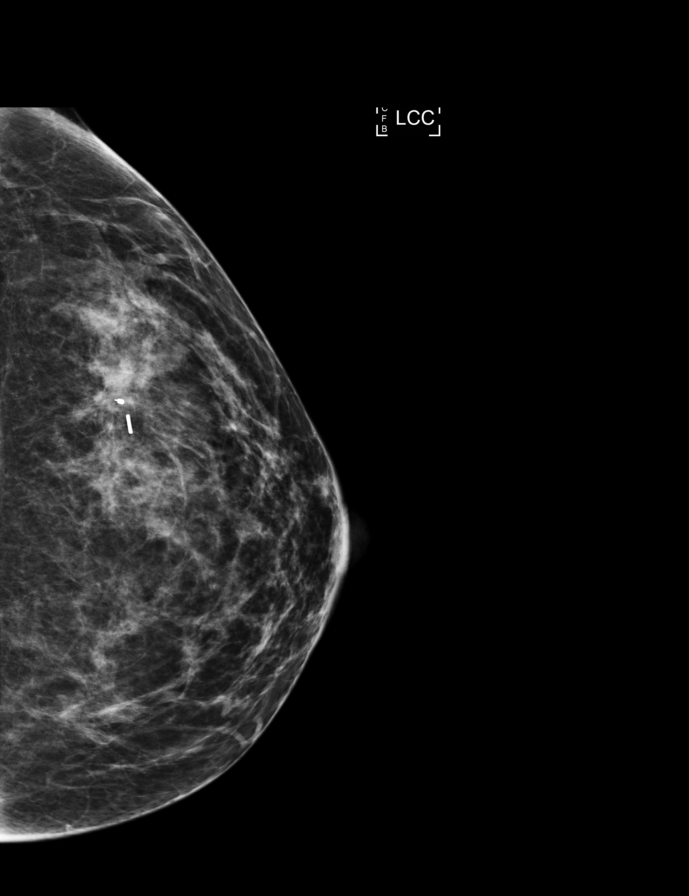

[L LM (2 of 2)]
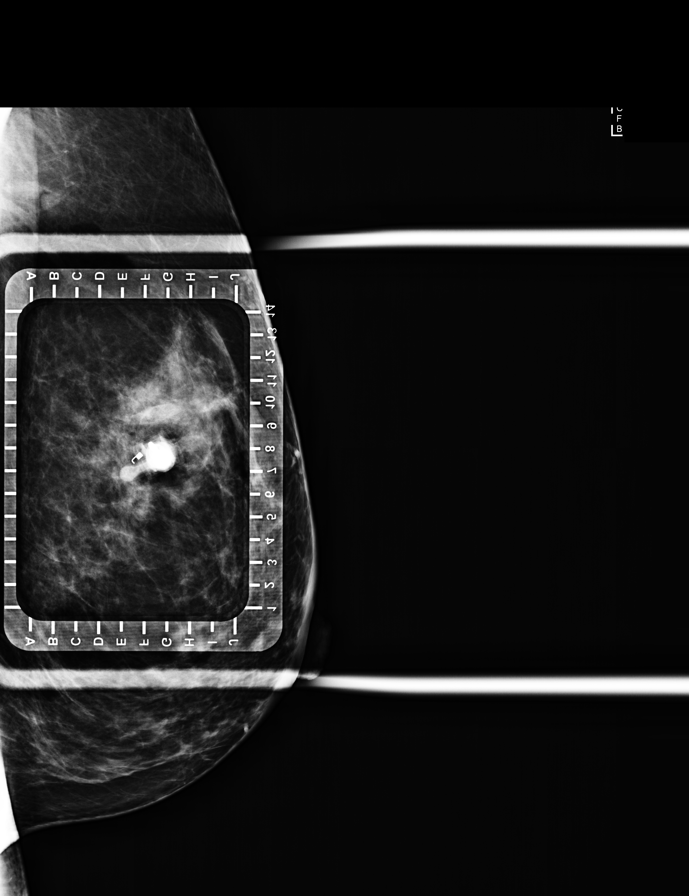

[7 of 7 positions shown; findings below may reference images not displayed]

FINDINGS: Patient presents for radioactive seed localization prior to
lumpectomy. I met with the patient and we discussed the procedure of
seed localization including benefits and alternatives. We discussed
the high likelihood of a successful procedure. We discussed the
risks of the procedure including infection, bleeding, tissue injury
and further surgery. We discussed the low dose of radioactivity
involved in the procedure. Informed, written consent was given.

The usual time-out protocol was performed immediately prior to the
procedure.

Using mammographic guidance, sterile technique, 1% lidocaine and an
[id number] radioactive seed, the biopsy site located approximately 1 cm
anterior to the coil shaped clip within the outer LEFT breast was
localized using a lateral approach. The follow-up mammogram images
confirm the seed in the expected location and were marked for Dr.
Bhebhe.

Follow-up survey of the patient confirms presence of the radioactive
seed.

Order number of [id number] seed:  [id number].

Total activity:  0.250 millicurie reference Date: 06/29/2020

The patient tolerated the procedure well and was released from the
[REDACTED]. She was given instructions regarding seed removal.
IMPRESSION: Radioactive seed localization left breast. No apparent
complications.

## 2022-02-27 IMAGING — DX MM BREAST SURGICAL SPECIMEN
1 series · 2 of 2 positions shown · non-contrast
Comparison: Previous exam(s).

CLINICAL DATA: Evaluate surgical specimen following lumpectomy for
LEFT breast cancer.

EXAM:
SPECIMEN RADIOGRAPH OF THE LEFT BREAST

[Series 2: specimen digital x-ray, derived · left · 0.07mm/px · 2 of 2 slices shown]
[im 1/2]
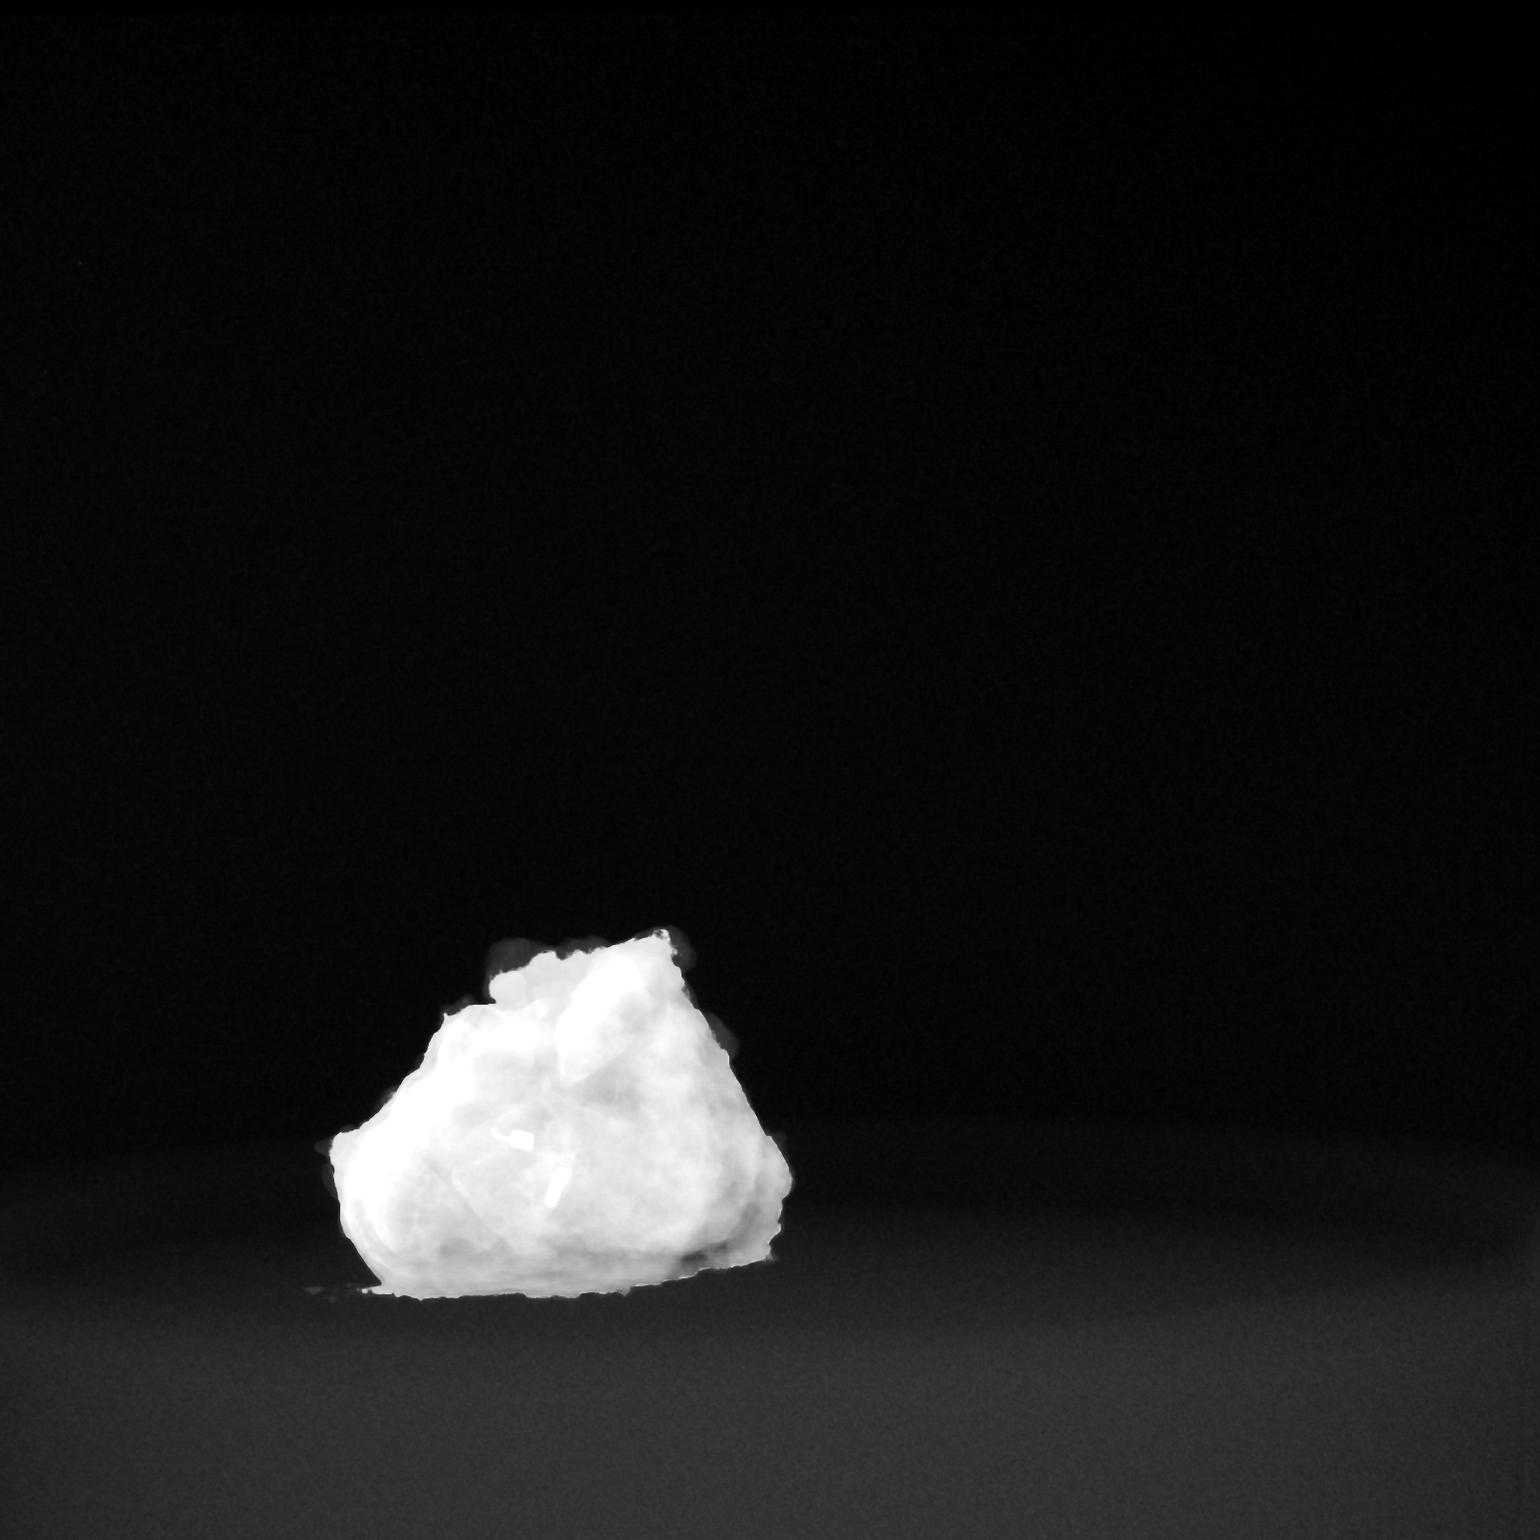
[im 2/2]
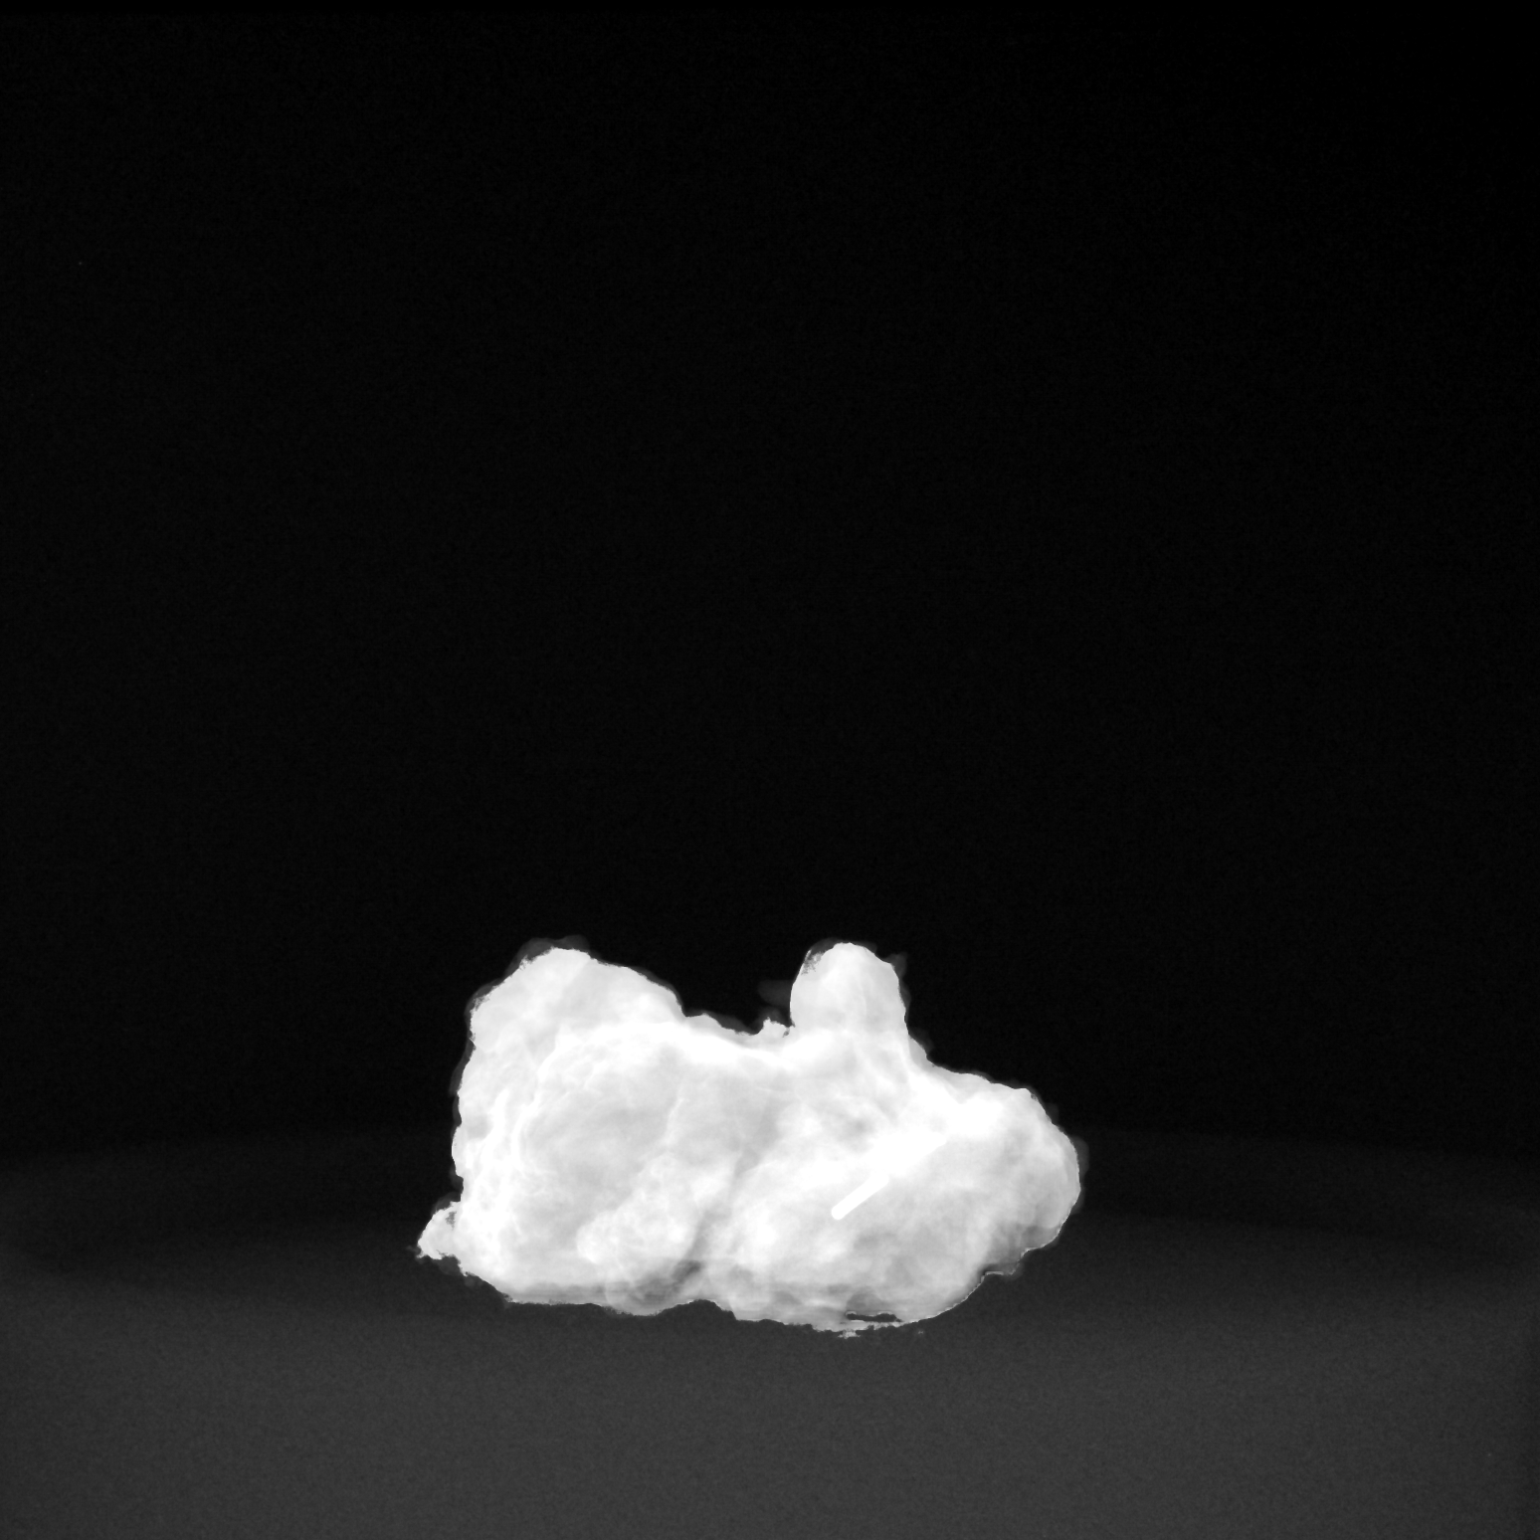

[2 of 2 positions shown; findings below may reference images not displayed]

FINDINGS: Status post excision of the LEFT breast. The radioactive seed and
COIL biopsy marker clip are present and completely intact.
IMPRESSION: Specimen radiograph of the LEFT breast.
